# Patient Record
Sex: Female | Born: 2000 | Race: Black or African American | Marital: Single | Smoking: Never smoker
Health system: Northeastern US, Academic
[De-identification: ages and names within clinical notes are randomized; demographics above are authoritative.]

## PROBLEM LIST (undated history)

## (undated) DIAGNOSIS — S62609A Fracture of unspecified phalanx of unspecified finger, initial encounter for closed fracture: Secondary | ICD-10-CM

---
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
## 2022-07-22 IMAGING — CT CT HEAD W/O CM
3 series · 15 of 47 positions shown, 18 images · non-contrast
Comparison: None.

CLINICAL DATA: Headache with migraine features since [REDACTED]

EXAM:
CT HEAD WITHOUT CONTRAST
TECHNIQUE: Contiguous axial images were obtained from the base of the skull
through the vertex without intravenous contrast.

[Series 2: ax head wo · axial · 0.37mm/px · z∈[+889,+1004]mm · 9 of 30 slices shown, 12 images]
[im 3/30  brain]
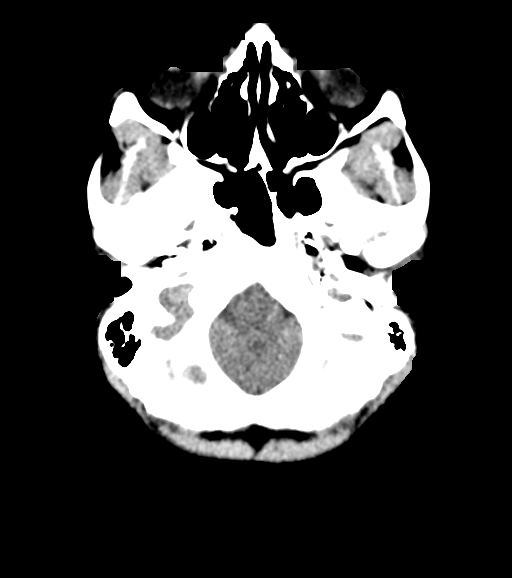
[im 3/30  bone]
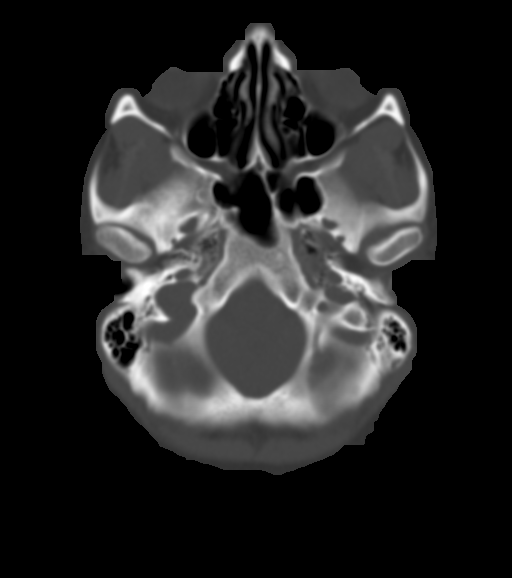
[im 6/30  brain]
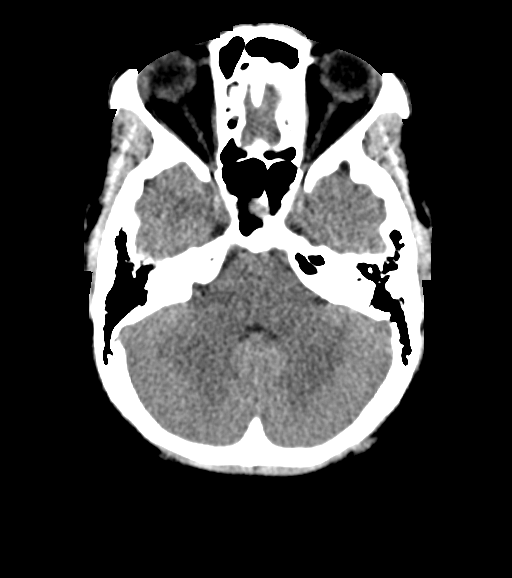
[im 9/30  brain]
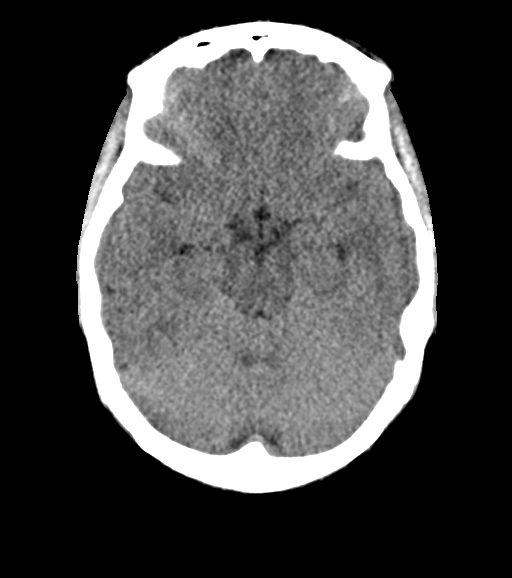
[im 12/30  brain]
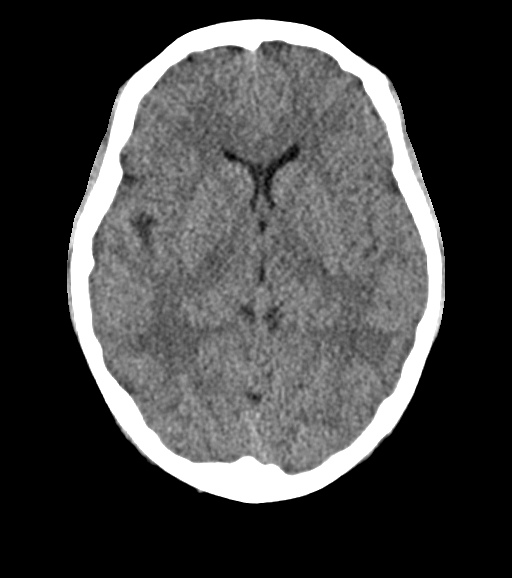
[im 16/30  brain]
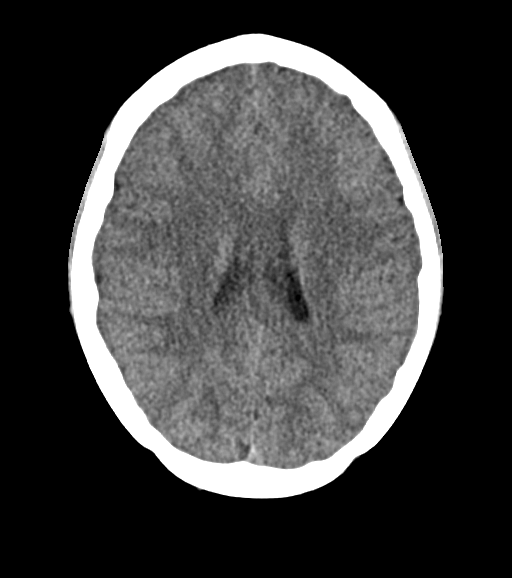
[im 16/30  bone]
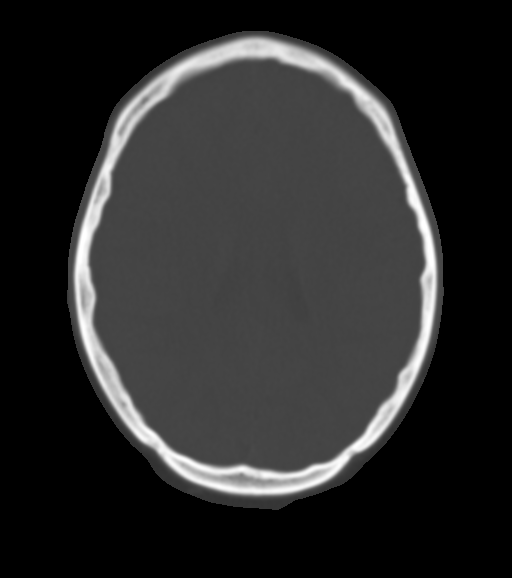
[im 19/30  brain]
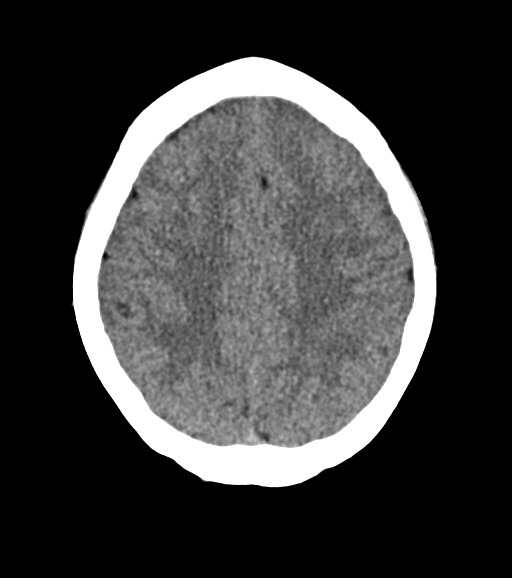
[im 22/30  brain]
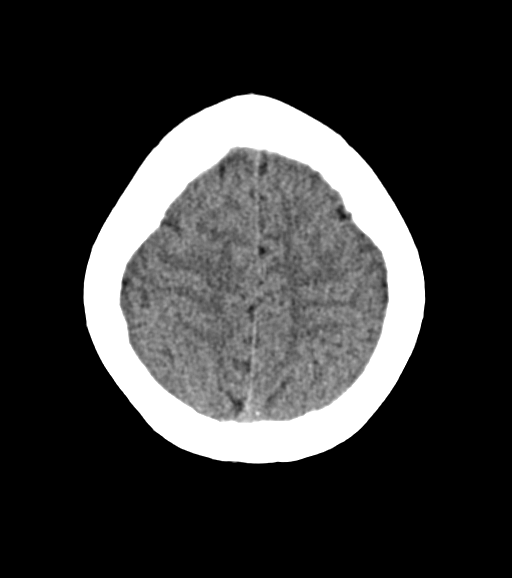
[im 25/30  brain]
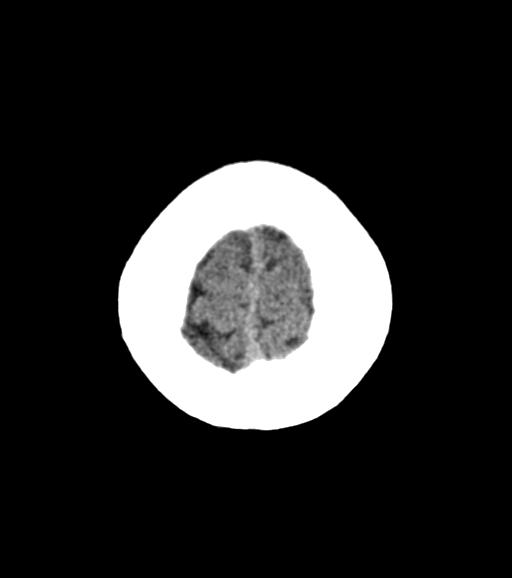
[im 28/30  brain]
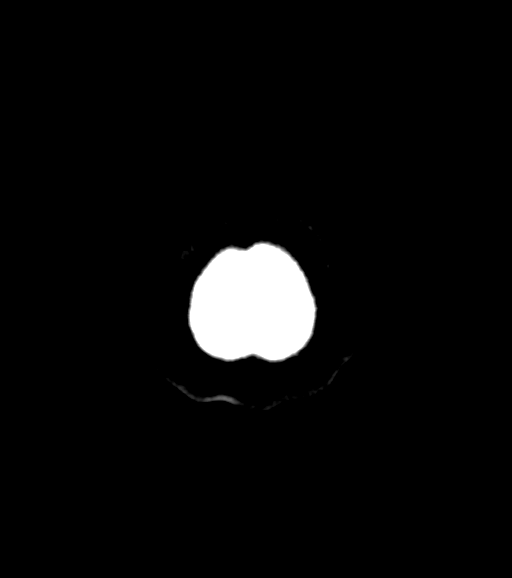
[im 28/30  bone]
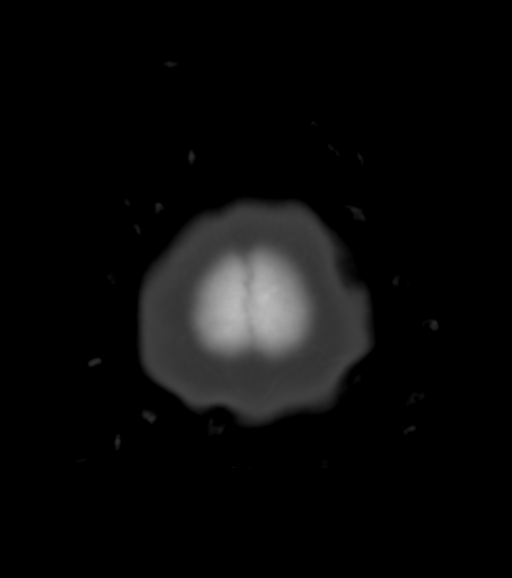

[Series 6: coronal soft · coronal · 0.40mm/px · 3 of 65 slices shown]
[im 22/65  brain]
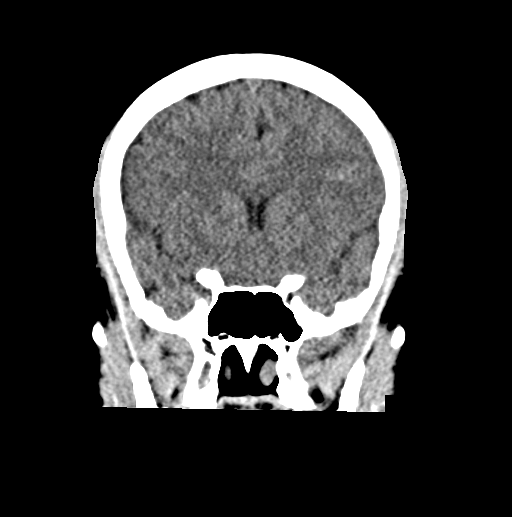
[im 29/65  brain]
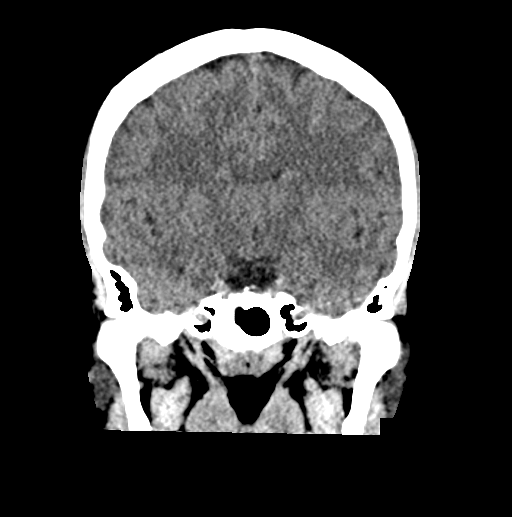
[im 36/65  brain]
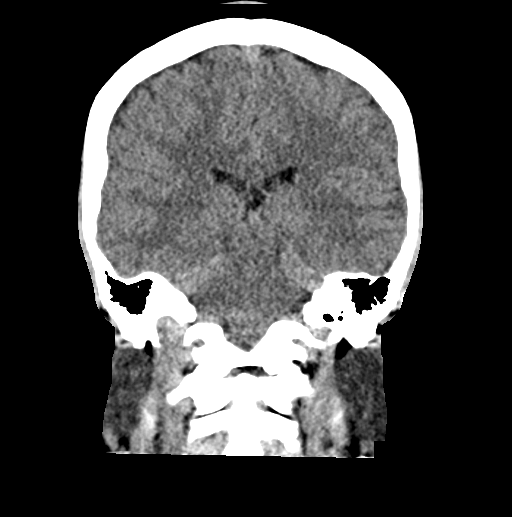

[Series 7: sag soft · sagittal · 0.38mm/px · 3 of 56 slices shown]
[im 19/56  brain]
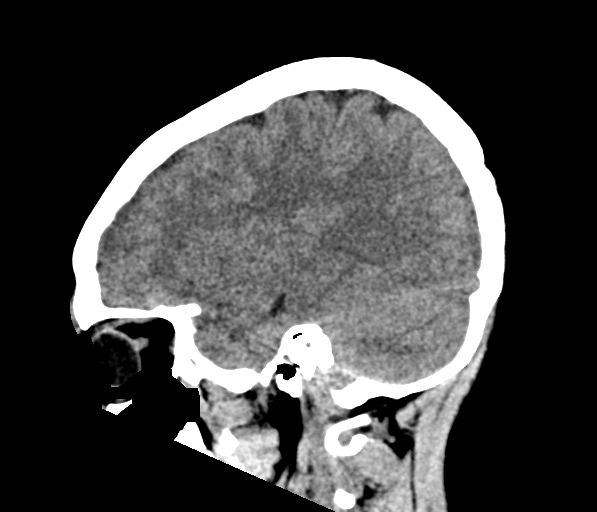
[im 28/56  brain]
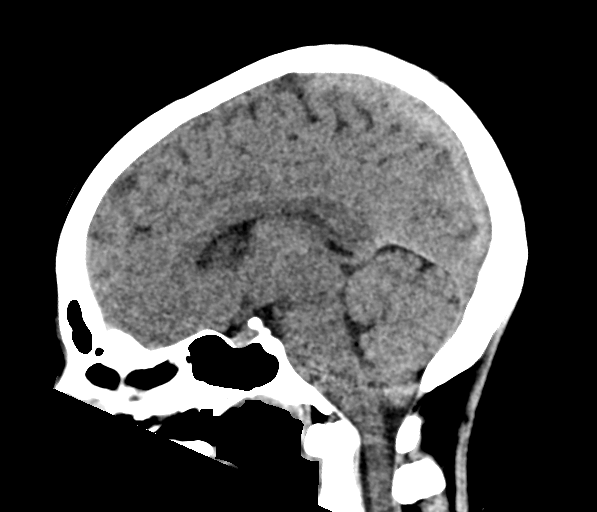
[im 37/56  brain]
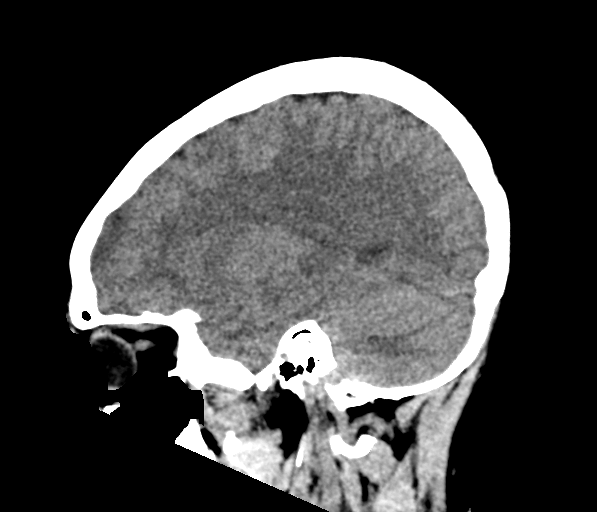

[15 of 47 positions shown; findings below may reference images not displayed]

FINDINGS: Brain: No evidence of acute infarction, hemorrhage, hydrocephalus,
extra-axial collection or mass lesion/mass effect. There is 7 mm of
extension of the cerebellar tonsils through the foramen magnum. No
empty or partially empty sella turcica.

Vascular: No hyperdense vessel or unexpected calcification.

Skull: Normal. Negative for fracture or focal lesion.

Sinuses/Orbits: Paranasal sinuses and mastoid air cells are
predominantly clear.

Other: None
IMPRESSION: 1. No acute intracranial findings.
2. 7 mm of extension of the cerebellar tonsils through the foramen
magnum. Findings likely reflecting cerebellar ectopia versus mild
Chiari I malformation.

## 7475-02-18 DEATH — deceased
